# Patient Record
Sex: Male | Born: 1965 | Race: White | Hispanic: No | Marital: Single | Smoking: Former smoker
Health system: Southern US, Community
[De-identification: ages and names within clinical notes are randomized; demographics above are authoritative.]

## PROBLEM LIST (undated history)

## (undated) DIAGNOSIS — C801 Malignant (primary) neoplasm, unspecified: Secondary | ICD-10-CM

## (undated) DIAGNOSIS — B20 Human immunodeficiency virus [HIV] disease: Secondary | ICD-10-CM

---

## 2015-09-04 ENCOUNTER — Emergency Department (HOSPITAL_COMMUNITY): Payer: Medicare PPO

## 2015-09-04 ENCOUNTER — Encounter (HOSPITAL_COMMUNITY): Payer: Self-pay | Admitting: Emergency Medicine

## 2015-09-04 ENCOUNTER — Emergency Department (HOSPITAL_COMMUNITY)
Admission: EM | Admit: 2015-09-04 | Discharge: 2015-09-04 | Disposition: A | Discharge status: Home / Self Care | Payer: Medicare PPO | Attending: Emergency Medicine | Admitting: Emergency Medicine

## 2015-09-04 DIAGNOSIS — Z87891 Personal history of nicotine dependence: Secondary | ICD-10-CM | POA: Insufficient documentation

## 2015-09-04 DIAGNOSIS — N390 Urinary tract infection, site not specified: Secondary | ICD-10-CM | POA: Diagnosis not present

## 2015-09-04 DIAGNOSIS — R52 Pain, unspecified: Secondary | ICD-10-CM

## 2015-09-04 DIAGNOSIS — R109 Unspecified abdominal pain: Secondary | ICD-10-CM | POA: Diagnosis present

## 2015-09-04 HISTORY — DX: Malignant (primary) neoplasm, unspecified: C80.1

## 2015-09-04 HISTORY — DX: Human immunodeficiency virus (HIV) disease: B20

## 2015-09-04 LAB — CBC WITH DIFFERENTIAL/PLATELET
BASOS PCT: 0 %
Basophils Absolute: 0 10*3/uL (ref 0.0–0.1)
Eosinophils Absolute: 0.1 10*3/uL (ref 0.0–0.7)
Eosinophils Relative: 1 %
HEMATOCRIT: 36.4 % — AB (ref 39.0–52.0)
HEMOGLOBIN: 12.3 g/dL — AB (ref 13.0–17.0)
LYMPHS PCT: 7 %
Lymphs Abs: 0.6 10*3/uL — ABNORMAL LOW (ref 0.7–4.0)
MCH: 27.6 pg (ref 26.0–34.0)
MCHC: 33.8 g/dL (ref 30.0–36.0)
MCV: 81.6 fL (ref 78.0–100.0)
MONO ABS: 1.1 10*3/uL — AB (ref 0.1–1.0)
MONOS PCT: 12 %
NEUTROS ABS: 7 10*3/uL (ref 1.7–7.7)
NEUTROS PCT: 80 %
Platelets: 181 10*3/uL (ref 150–400)
RBC: 4.46 MIL/uL (ref 4.22–5.81)
RDW: 12.3 % (ref 11.5–15.5)
WBC: 8.7 10*3/uL (ref 4.0–10.5)

## 2015-09-04 LAB — COMPREHENSIVE METABOLIC PANEL
ALBUMIN: 3.5 g/dL (ref 3.5–5.0)
ALK PHOS: 90 U/L (ref 38–126)
ALT: 11 U/L — ABNORMAL LOW (ref 17–63)
ANION GAP: 8 (ref 5–15)
AST: 19 U/L (ref 15–41)
BILIRUBIN TOTAL: 0.7 mg/dL (ref 0.3–1.2)
BUN: 10 mg/dL (ref 6–20)
CALCIUM: 8.4 mg/dL — AB (ref 8.9–10.3)
CO2: 27 mmol/L (ref 22–32)
Chloride: 97 mmol/L — ABNORMAL LOW (ref 101–111)
Creatinine, Ser: 0.76 mg/dL (ref 0.61–1.24)
GFR calc Af Amer: >60 mL/min (ref >60)
GLUCOSE: 106 mg/dL — AB (ref 65–99)
Potassium: 3.3 mmol/L — ABNORMAL LOW (ref 3.5–5.1)
Sodium: 132 mmol/L — ABNORMAL LOW (ref 135–145)
TOTAL PROTEIN: 7.3 g/dL (ref 6.5–8.1)

## 2015-09-04 LAB — URINALYSIS, ROUTINE W REFLEX MICROSCOPIC
Bilirubin Urine: NEGATIVE
GLUCOSE, UA: NEGATIVE mg/dL
KETONES UR: NEGATIVE mg/dL
Nitrite: NEGATIVE
PROTEIN: 30 mg/dL — AB
Specific Gravity, Urine: 1.02 (ref 1.005–1.030)
pH: 7 (ref 5.0–8.0)

## 2015-09-04 LAB — URINE MICROSCOPIC-ADD ON

## 2015-09-04 MED ORDER — ONDANSETRON 4 MG PO TBDP
4.0000 mg | ORAL_TABLET | Freq: Once | ORAL | Status: AC
  Administered 2015-09-04: 4 mg via ORAL
  Filled 2015-09-04: qty 1

## 2015-09-04 MED ORDER — HYDROMORPHONE HCL 1 MG/ML IJ SOLN
1.0000 mg | Freq: Once | INTRAMUSCULAR | Status: AC
  Administered 2015-09-04: 1 mg via INTRAVENOUS
  Filled 2015-09-04: qty 1

## 2015-09-04 MED ORDER — DEXTROSE 5 % IV SOLN
1.0000 g | Freq: Once | INTRAVENOUS | Status: AC
  Administered 2015-09-04: 1 g via INTRAVENOUS
  Filled 2015-09-04: qty 10

## 2015-09-04 NOTE — ED Notes (Signed)
MD at bedside. 

## 2015-09-04 NOTE — Discharge Instructions (Signed)
Take the amoxicillin you were prescribed. Follow-up with her doctor next week for recheck drink plenty of fluids

## 2015-09-04 NOTE — ED Provider Notes (Signed)
[facility] DEPT Provider Note   CSN: [id number] Arrival date & time: 09/04/15  1155  First Provider Contact:  First MD Initiated Contact with Patient 09/04/15 1216        History   Chief Complaint Chief Complaint  Patient presents with  . Flank Pain    HPI Tyrone Weiss is a 50 y.o. male.  Patient complains of suprapubic abdominal pain and lower back pain.   The history is provided by the patient. No language interpreter was used.  Flank Pain  This is a new problem. The current episode started 3 to 5 hours ago. The problem occurs constantly. The problem has not changed since onset.Associated symptoms include abdominal pain. Pertinent negatives include no chest pain and no headaches. Nothing aggravates the symptoms. Nothing relieves the symptoms. He has tried nothing for the symptoms. The treatment provided no relief.    Past Medical History:  Diagnosis Date  . Cancer (HCC)    throat CX  . HIV (human immunodeficiency virus infection) ([place])     There are no active problems to display for this patient.   History reviewed. No pertinent surgical history.     Home Medications    Prior to Admission medications   Not on File    Family History Family History  Problem Relation Age of Onset  . Cancer Mother   . Cancer Father   . Asthma Brother     Social History Social History  Substance Use Topics  . Smoking status: Former Smoker    Types: E-cigarettes  . Smokeless tobacco: Current User  . Alcohol use No     Allergies   Morphine and related   Review of Systems Review of Systems  Constitutional: Negative for appetite change and fatigue.  HENT: Negative for congestion, ear discharge and sinus pressure.   Eyes: Negative for discharge.  Respiratory: Negative for cough.   Cardiovascular: Negative for chest pain.  Gastrointestinal: Positive for abdominal pain. Negative for diarrhea.  Genitourinary: Positive for flank pain. Negative for frequency and  hematuria.  Musculoskeletal: Negative for back pain.  Skin: Negative for rash.  Neurological: Negative for seizures and headaches.  Psychiatric/Behavioral: Negative for hallucinations.     Physical Exam Updated Vital Signs BP 143/91   Pulse 74   Temp 98.1 F (36.7 C) (Oral)   Resp 18   Ht 5\' 11"  (1.803 m)   Wt 160 lb 4 oz (72.7 kg)   SpO2 93%   BMI 22.35 kg/m   Physical Exam  Constitutional: He is oriented to person, place, and time. He appears well-developed.  HENT:  Head: Normocephalic.  Eyes: Conjunctivae and EOM are normal. No scleral icterus.  Neck: Neck supple. No thyromegaly present.  Cardiovascular: Normal rate and regular rhythm.  Exam reveals no gallop and no friction rub.   No murmur heard. Pulmonary/Chest: No stridor. He has no wheezes. He has no rales. He exhibits no tenderness.  Abdominal: He exhibits no distension. There is tenderness. There is no rebound.  Patient has mild to moderate suprapubic tenderness  Genitourinary:  Genitourinary Comments: Large right inguinal hernia that goes into his scrotum. Nontender  Musculoskeletal: Normal range of motion. He exhibits no edema.  Lymphadenopathy:    He has no cervical adenopathy.  Neurological: He is oriented to person, place, and time. He exhibits normal muscle tone. Coordination normal.  Skin: No rash noted. No erythema.  Psychiatric: He has a normal mood and affect. His behavior is normal.     ED Treatments /  Results  Labs (all labs ordered are listed, but only abnormal results are displayed) Labs Reviewed  CBC WITH DIFFERENTIAL/PLATELET - Abnormal; Notable for the following:       Result Value   Hemoglobin 12.3 (*)    HCT 36.4 (*)    Lymphs Abs 0.6 (*)    Monocytes Absolute 1.1 (*)    All other components within normal limits  COMPREHENSIVE METABOLIC PANEL - Abnormal; Notable for the following:    Sodium 132 (*)    Potassium 3.3 (*)    Chloride 97 (*)    Glucose, Bld 106 (*)    Calcium 8.4 (*)     ALT 11 (*)    All other components within normal limits  URINALYSIS, ROUTINE W REFLEX MICROSCOPIC (NOT AT [facility]) - Abnormal; Notable for the following:    Hgb urine dipstick TRACE (*)    Protein, ur 30 (*)    Leukocytes, UA TRACE (*)    All other components within normal limits  URINE MICROSCOPIC-ADD ON - Abnormal; Notable for the following:    Squamous Epithelial / LPF 0-5 (*)    Bacteria, UA FEW (*)    All other components within normal limits  URINE CULTURE    EKG  EKG Interpretation None       Radiology Ct Renal Stone Study  Result Date: 09/04/2015 CLINICAL DATA:  Low back region pain EXAM: CT ABDOMEN AND PELVIS WITHOUT CONTRAST TECHNIQUE: Multidetector CT imaging of the abdomen and pelvis was performed following the standard protocol without oral or intravenous contrast material administration. COMPARISON:  None. FINDINGS: Lower chest: There is posterior bibasilar lung atelectatic change. Lung bases otherwise are clear. Hepatobiliary: No focal liver lesions are evident on this noncontrast enhanced study. Gallbladder wall does not appear appreciably thickened. There is no biliary duct dilatation. Pancreas: No pancreatic mass or inflammatory focus. Spleen: No splenic lesions are evident. Spleen is prominent, measuring 13.5 x 11.3 x 8.1 cm with a measured splenic volume of 618 cubic cm. No focal splenic lesions are evident. Adrenals/Urinary Tract: Adrenals appear unremarkable bilaterally. Kidneys bilaterally show no appreciable mass or hydronephrosis on either side. There is a 2 mm calculus in the lower pole of the right kidney, nonobstructing. There is a nonobstructing 1 mm calculus in the lower pole left kidney. Each kidney shows mild fetal lobulation, an anatomic variant. There is no ureteral calculus on either side. Urinary bladder is midline with wall thickness within normal limits. Stomach/Bowel: There is a sizable inguinal hernia extending into the right scrotal sac region which  contains large bowel. There is no bowel compromise. The bowel this area is not thickened. There is a much smaller left inguinal hernia which does not contain bowel but does contain fibrous appearing material. There is no bowel wall or mesenteric thickening. No bowel obstruction. No free air or portal venous air. Vascular/Lymphatic: There is atherosclerotic calcification in the abdominal aorta and proximal common iliac arteries. There is no appreciable abdominal aortic aneurysm. The major mesenteric vessels appear patent on this noncontrast enhanced study. There is moderate atherosclerotic calcification at the origin of the left renal artery. There is no adenopathy in the abdomen or pelvis. Reproductive: There are several small prostatic calculi. Prostate and seminal vesicles appear normal. There is no evident pelvic mass or pelvic fluid collection. Other: Appendix appears unremarkable. There is no abscess or ascites in the abdomen or pelvis. Musculoskeletal: There is degenerative change at L4-5 with Schmorl's nodes at L4 inferiorly and at L5 superiorly. There are  no blastic or lytic bone lesions. There is no intramuscular or abdominal wall lesion. There is a probable small bone island in the intertrochanteric region on the left. IMPRESSION: There is a right inguinal hernia with colon extending into this hernia. This hernia tracks into the right scrotum. No bowel compromise. There is a much smaller left inguinal hernia which contains fibrous material but no bowel. No bowel obstruction.  No abscess.  Appendix appears normal. Nonobstructing calculi in each kidney. No hydronephrosis or ureteral calculus. Prominent spleen.  No focal splenic lesions evident. There are occasional small prostatic calculi. There is aortic atherosclerosis. There is moderate calcification at the origin of the left renal artery without high-grade obstruction evident on this noncontrast enhanced study. Electronically Signed   By: Lowella Grip III M.D.   On: 09/04/2015 13:19   Procedures Procedures (including critical care time)  Medications Ordered in ED Medications  HYDROmorphone (DILAUDID) injection 1 mg (1 mg Intravenous Given 09/04/15 1334)  ondansetron (ZOFRAN-ODT) disintegrating tablet 4 mg (4 mg Oral Given 09/04/15 1334)  cefTRIAXone (ROCEPHIN) 1 g in dextrose 5 % 50 mL IVPB (0 g Intravenous Stopped 09/04/15 1502)     Initial Impression / Assessment and Plan / ED Course  I have reviewed the triage vital signs and the nursing notes.  Pertinent labs & imaging results that were available during my care of the patient were reviewed by me and considered in my medical decision making (see chart for details).  Clinical Course    Labs show patient have a urinary tract infection. CT scan shows inguinal hernia. Patient is on amoxicillin although is only taken 1 pill. He will continue the amoxicillin and take his pain medicine and follow-up with his PCP next week Final Clinical Impressions(s) / ED Diagnoses   Final diagnoses:  Pain  UTI (lower urinary tract infection)    New Prescriptions New Prescriptions   No medications on file     Milton Ferguson, MD 09/04/15 1558

## 2015-09-04 NOTE — ED Triage Notes (Signed)
Patient states he had some teeth pulled yesterday and was given demerol for the pain. States he woke this morning with severe bilateral flank pain. States pain radiates "all the way across my stomach and my back." Patient unable to sit comfortably in triage.

## 2015-09-06 LAB — URINE CULTURE: Culture: NO GROWTH

## 2017-12-05 IMAGING — CT CT RENAL STONE PROTOCOL
2 of 4 series · 14 of 46 positions shown, 16 images · non-contrast
Comparison: None.

CLINICAL DATA: Low back region pain

EXAM:
CT ABDOMEN AND PELVIS WITHOUT CONTRAST
TECHNIQUE: Multidetector CT imaging of the abdomen and pelvis was performed
following the standard protocol without oral or intravenous contrast
material administration.

[Series 2: routine abd pel with · axial · 0.66mm/px · z∈[-510,-40]mm · 11 of 112 slices shown, 13 images]
[im 9/112  soft-tissue]
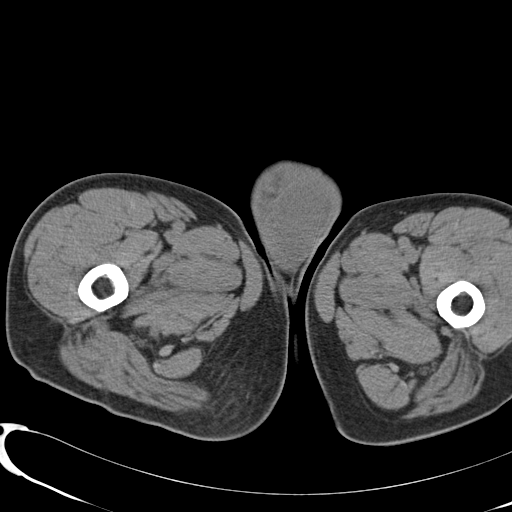
[im 9/112  bone]
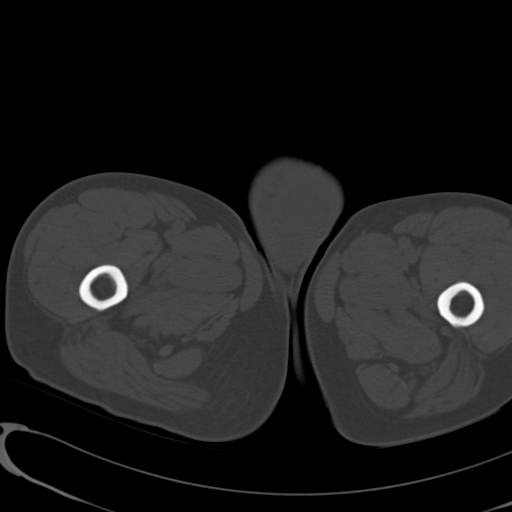
[im 18/112  soft-tissue]
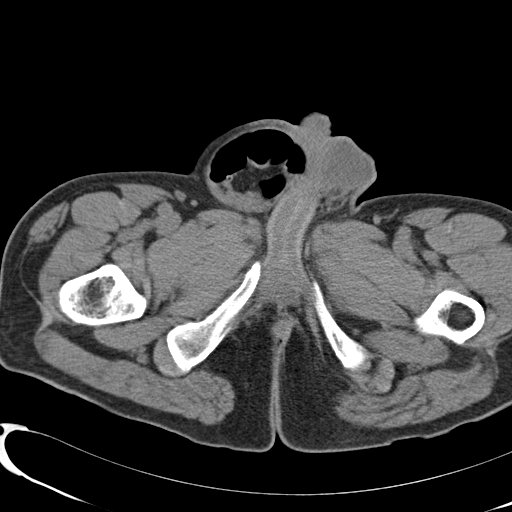
[im 26/112  soft-tissue]
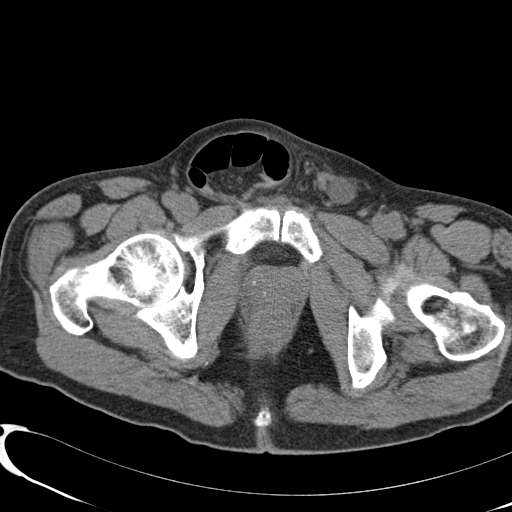
[im 35/112  soft-tissue]
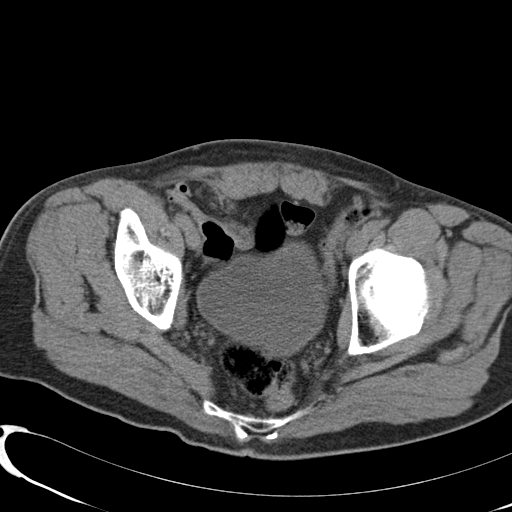
[im 47/112  soft-tissue]
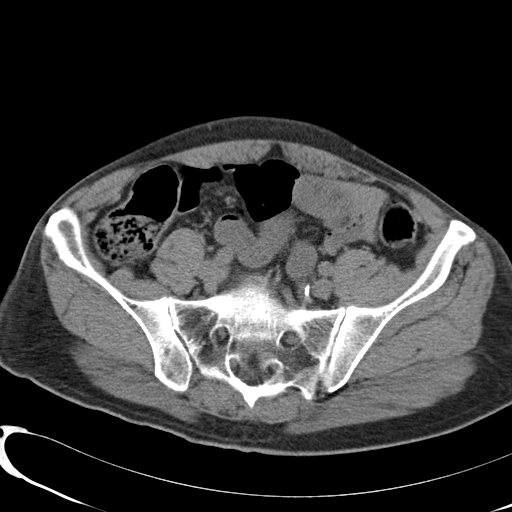
[im 56/112  soft-tissue]
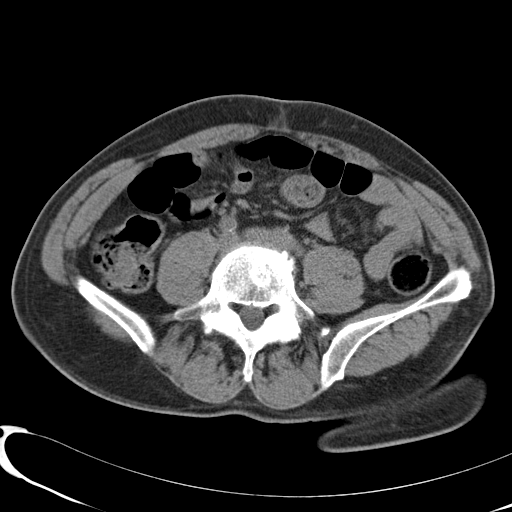
[im 65/112  soft-tissue]
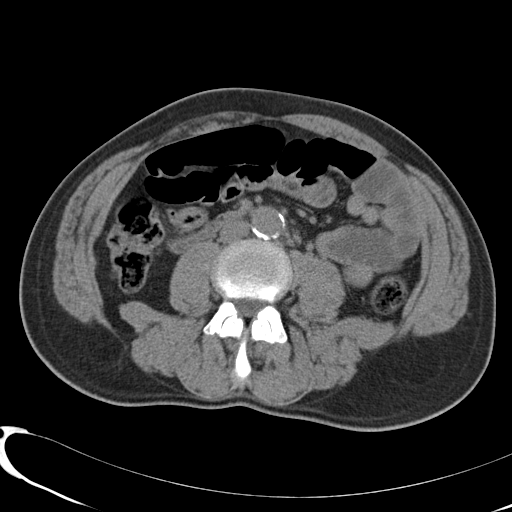
[im 77/112  soft-tissue]
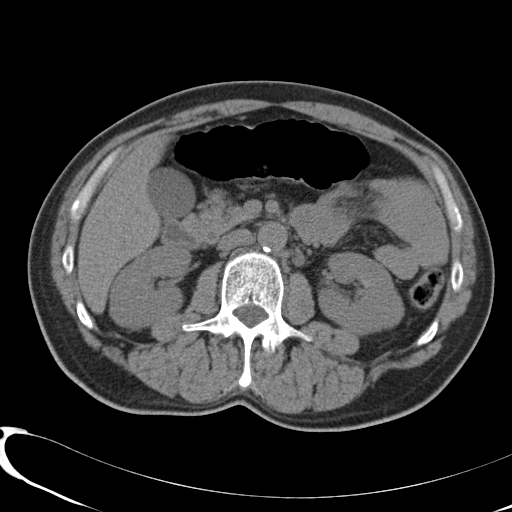
[im 86/112  soft-tissue]
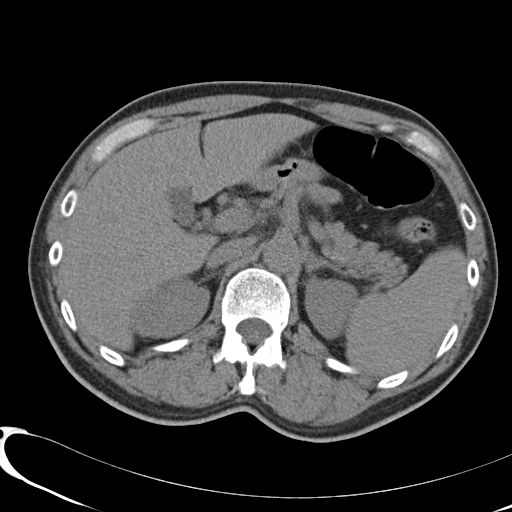
[im 86/112  bone]
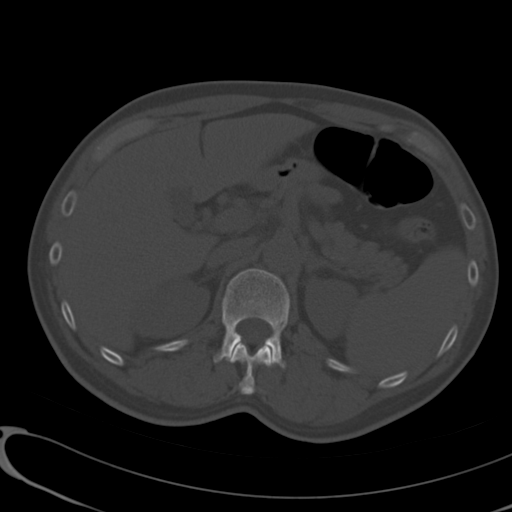
[im 94/112  soft-tissue]
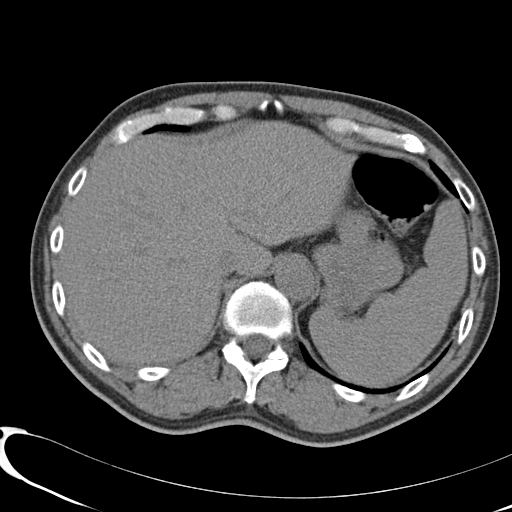
[im 103/112  soft-tissue]
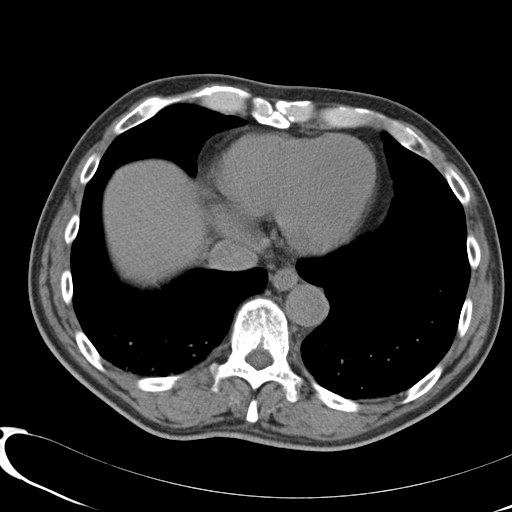

[Series 4: coronal · coronal · 0.68mm/px · 3 of 120 slices shown]
[im 40/120  soft-tissue]
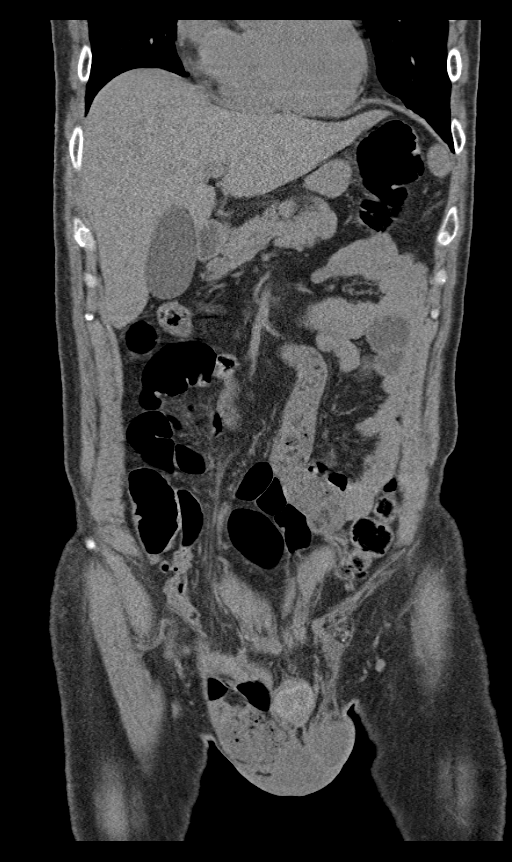
[im 53/120  soft-tissue]
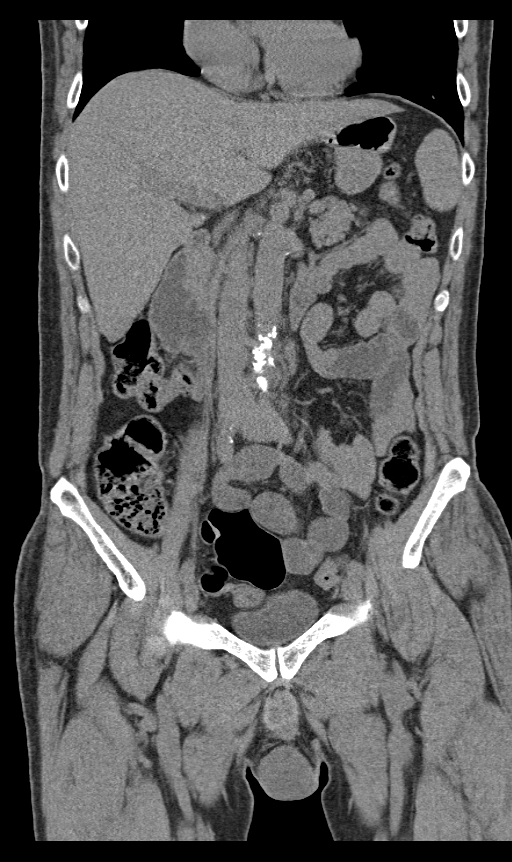
[im 67/120  soft-tissue]
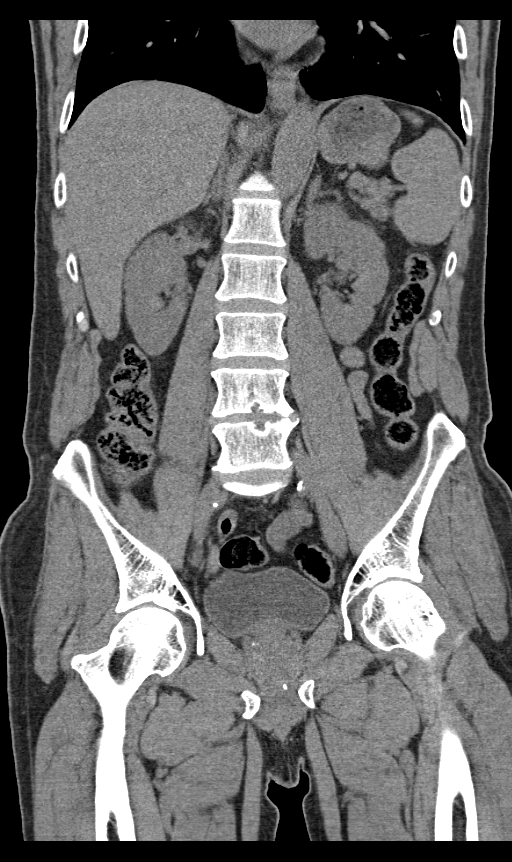

[14 of 46 positions shown; findings below may reference images not displayed]

FINDINGS: Lower chest: There is posterior bibasilar lung atelectatic change.
Lung bases otherwise are clear.

Hepatobiliary: No focal liver lesions are evident on this
noncontrast enhanced study. Gallbladder wall does not appear
appreciably thickened. There is no biliary duct dilatation.

Pancreas: No pancreatic mass or inflammatory focus.

Spleen: No splenic lesions are evident. Spleen is prominent,
measuring 13.5 x 11.3 x 8.1 cm with a measured splenic volume of 618
cubic cm. No focal splenic lesions are evident.

Adrenals/Urinary Tract: Adrenals appear unremarkable bilaterally.
Kidneys bilaterally show no appreciable mass or hydronephrosis on
either side. There is a 2 mm calculus in the lower pole of the right
kidney, nonobstructing. There is a nonobstructing 1 mm calculus in
the lower pole left kidney. Each kidney shows mild fetal lobulation,
an anatomic variant. There is no ureteral calculus on either side.
Urinary bladder is midline with wall thickness within normal limits.

Stomach/Bowel: There is a sizable inguinal hernia extending into the
right scrotal sac region which contains large bowel. There is no
bowel compromise. The bowel this area is not thickened. There is a
much smaller left inguinal hernia which does not contain bowel but
does contain fibrous appearing material. There is no bowel wall or
mesenteric thickening. No bowel obstruction. No free air or portal
venous air.

Vascular/Lymphatic: There is atherosclerotic calcification in the
abdominal aorta and proximal common iliac arteries. There is no
appreciable abdominal aortic aneurysm. The major mesenteric vessels
appear patent on this noncontrast enhanced study. There is moderate
atherosclerotic calcification at the origin of the left renal
artery. There is no adenopathy in the abdomen or pelvis.

Reproductive: There are several small prostatic calculi. Prostate
and seminal vesicles appear normal. There is no evident pelvic mass
or pelvic fluid collection.

Other: Appendix appears unremarkable. There is no abscess or ascites
in the abdomen or pelvis.

Musculoskeletal: There is degenerative change at L4-5 with Schmorl's
nodes at L4 inferiorly and at L5 superiorly. There are no blastic or
lytic bone lesions. There is no intramuscular or abdominal wall
lesion. There is a probable small bone island in the
intertrochanteric region on the left.
IMPRESSION: There is a right inguinal hernia with colon extending into this
hernia. This hernia tracks into the right scrotum. No bowel
compromise. There is a much smaller left inguinal hernia which
contains fibrous material but no bowel.

No bowel obstruction.  No abscess.  Appendix appears normal.

Nonobstructing calculi in each kidney. No hydronephrosis or ureteral
calculus.

Prominent spleen.  No focal splenic lesions evident.

There are occasional small prostatic calculi.

There is aortic atherosclerosis. There is moderate calcification at
the origin of the left renal artery without high-grade obstruction
evident on this noncontrast enhanced study.
